# Patient Record
Sex: Female | Born: 1955 | Race: White | Hispanic: No | Marital: Single
Health system: Southern US, Community
[De-identification: ages and names within clinical notes are randomized; demographics above are authoritative.]

---

## 2005-02-10 ENCOUNTER — Emergency Department: Payer: Self-pay | Admitting: Emergency Medicine

## 2005-02-13 ENCOUNTER — Emergency Department: Payer: Self-pay | Admitting: Emergency Medicine

## 2005-12-10 ENCOUNTER — Other Ambulatory Visit: Payer: Self-pay

## 2005-12-11 ENCOUNTER — Inpatient Hospital Stay: Payer: Self-pay | Admitting: Internal Medicine

## 2011-07-08 ENCOUNTER — Emergency Department: Payer: Self-pay | Admitting: Emergency Medicine

## 2013-02-26 IMAGING — CR RIGHT FOOT COMPLETE - 3+ VIEW
1 series · 3 of 3 positions shown · non-contrast
Comparison: none

REASON FOR EXAM: pain
COMMENTS:   May transport without cardiac monitor

PROCEDURE:     DXR - DXR FOOT RT COMPLETE W/OBLIQUES  - July 08, 2011 [DATE]
RESULT:     Comparison: None.

[Series 1: ap · 0.17mm/px · 3 of 3 slices shown]
[im 1/3]
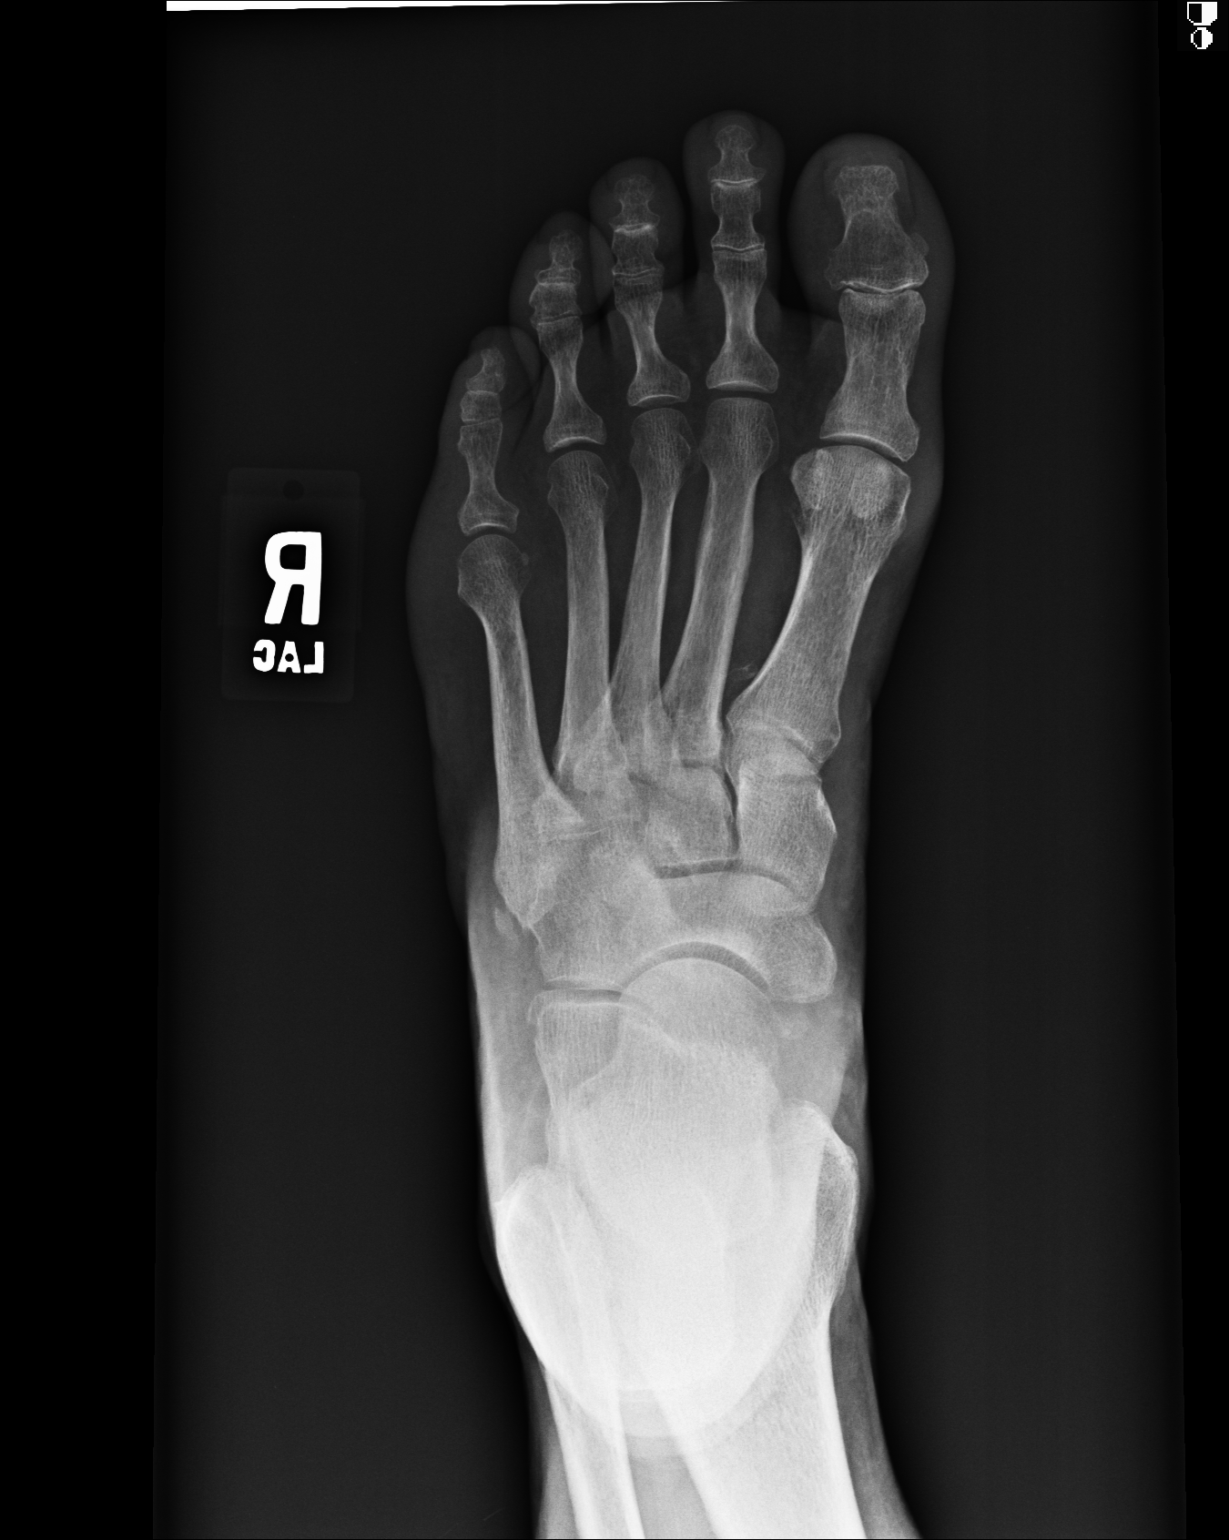
[im 2/3]
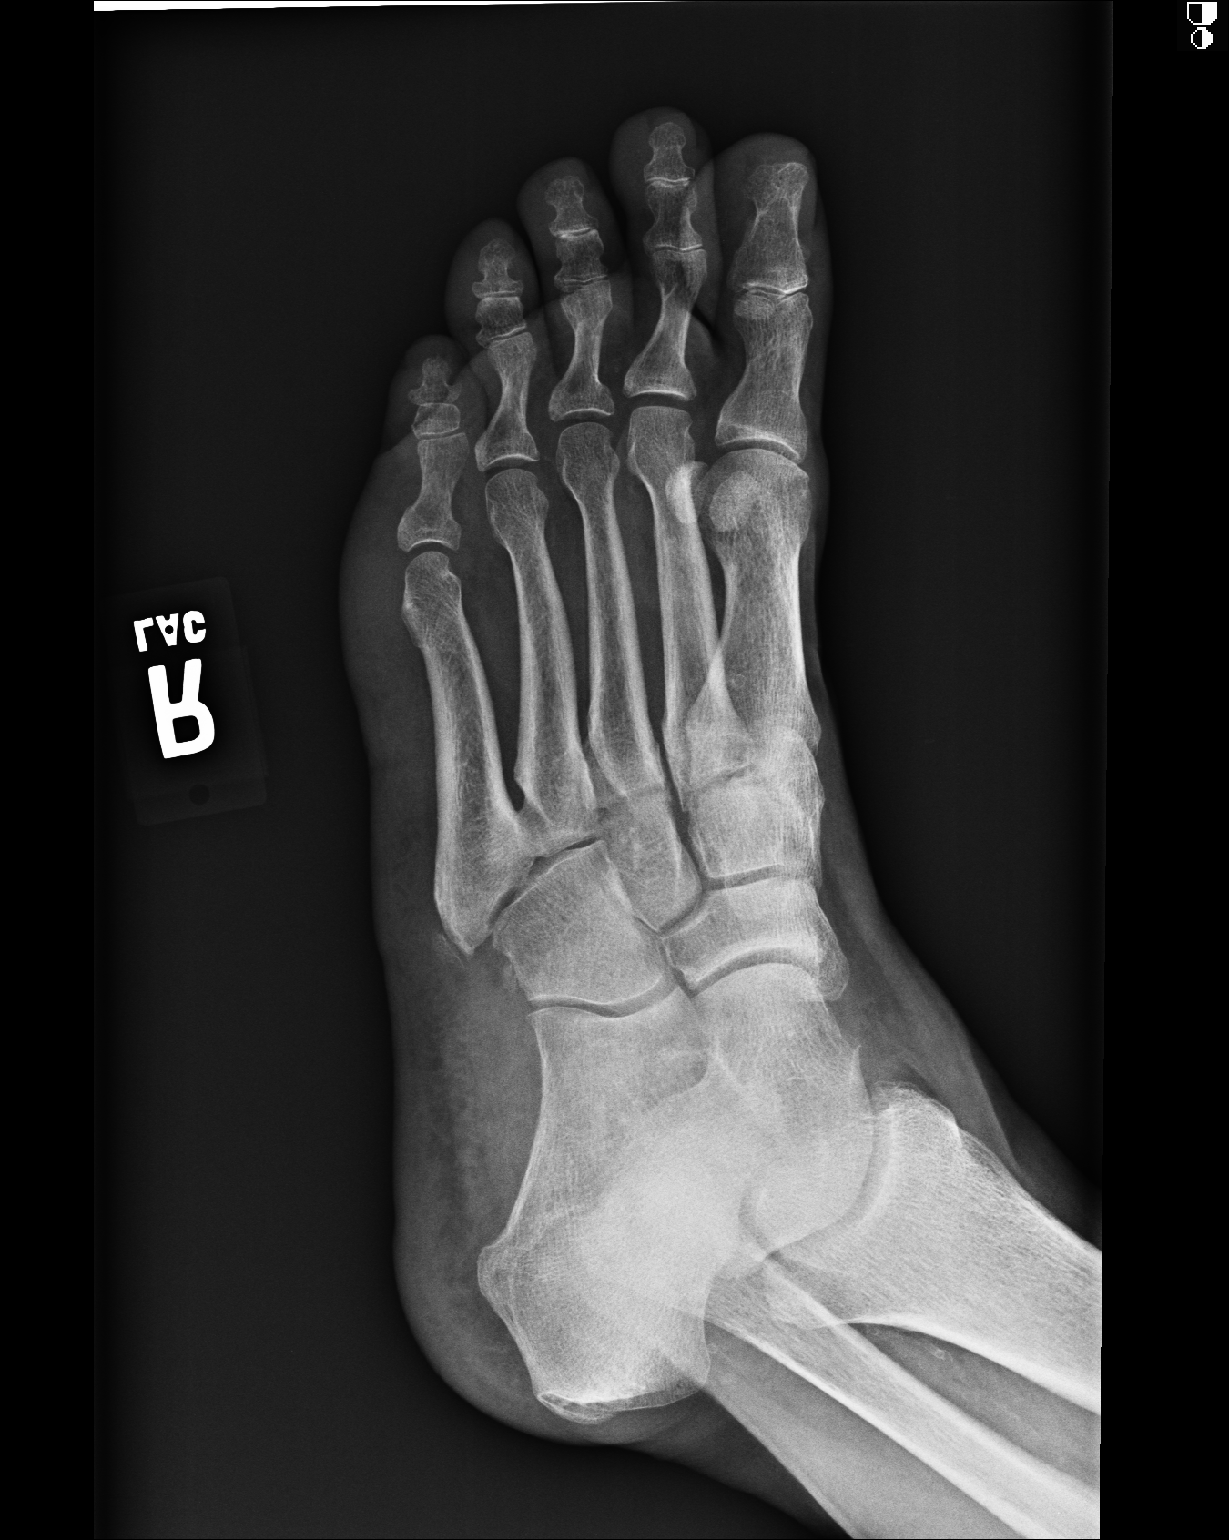
[im 3/3]
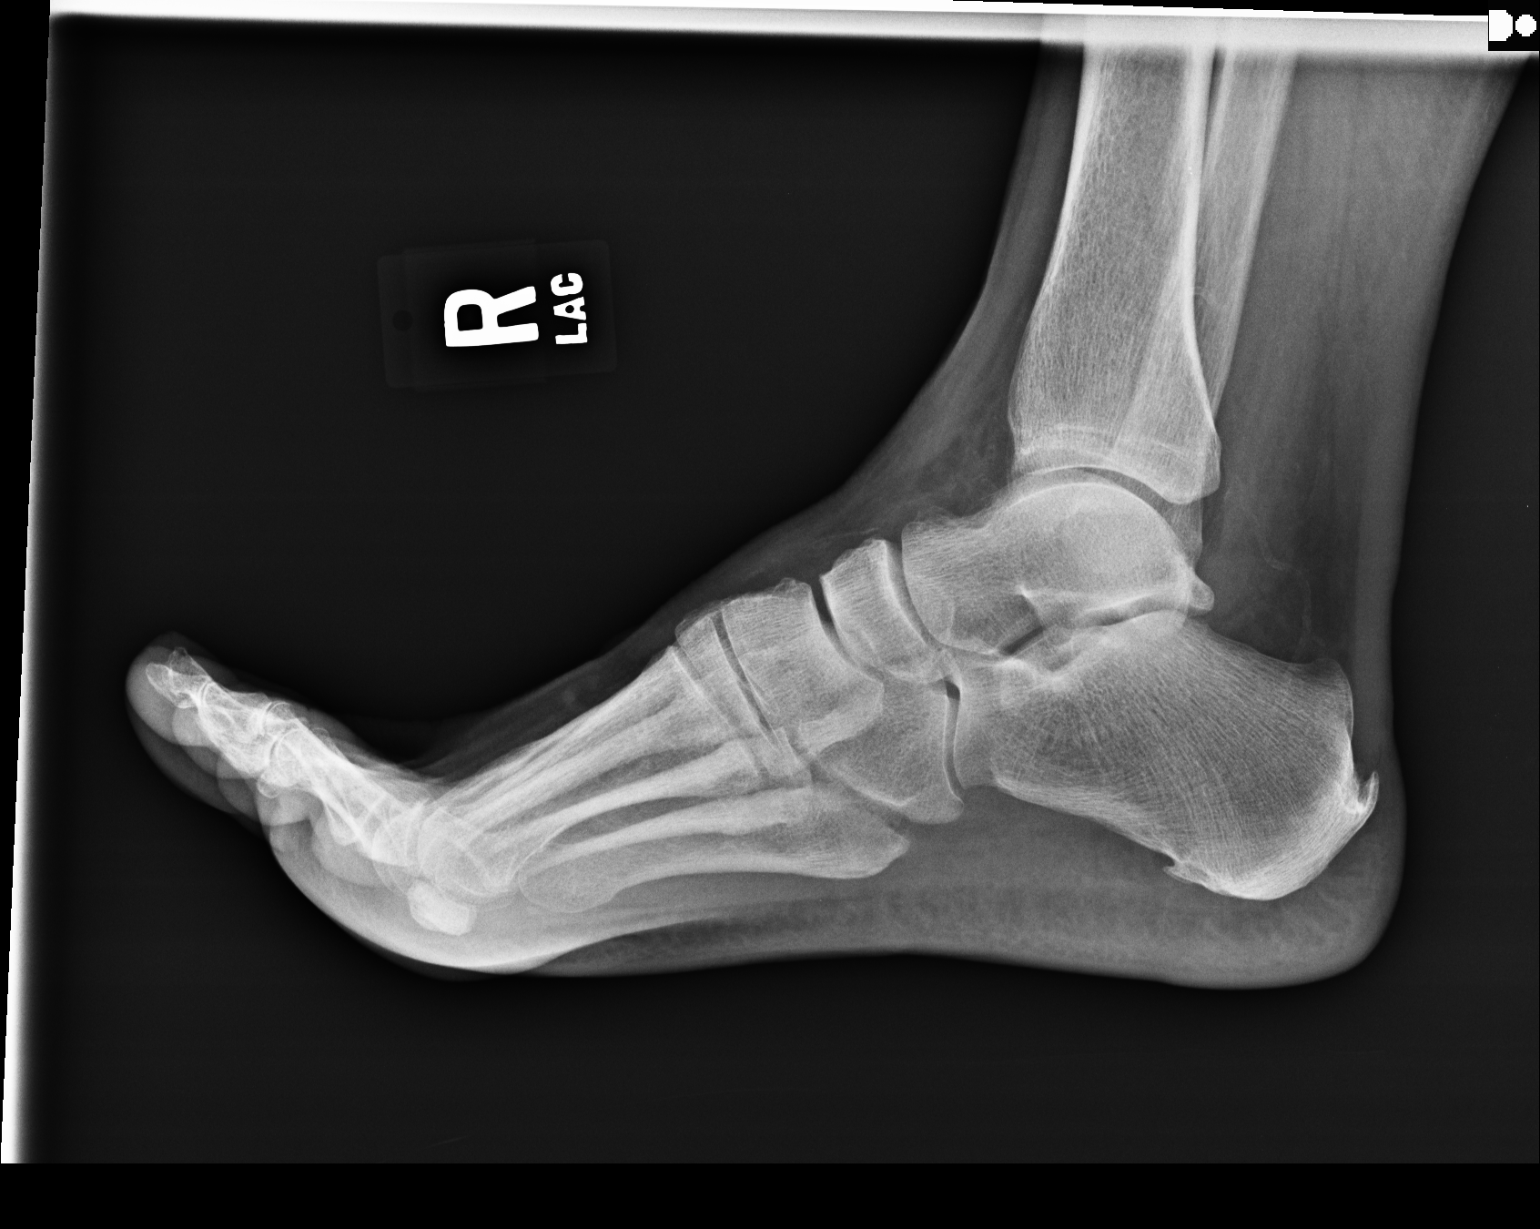

[3 of 3 positions shown; findings below may reference images not displayed]

FINDINGS: No acute fracture. Normal alignment. Joint spaces are maintained. There are
vascular calcifications.
IMPRESSION: No acute findings.

## 2018-09-24 ENCOUNTER — Other Ambulatory Visit: Payer: Self-pay | Admitting: Physician Assistant

## 2018-09-24 NOTE — Telephone Encounter (Signed)
Refill approved for Acyclovir 800mg  #30 1 po QD for 7 months.

## 2019-04-12 ENCOUNTER — Other Ambulatory Visit: Payer: Self-pay | Admitting: Physician Assistant

## 2019-04-12 NOTE — Telephone Encounter (Signed)
Will approve Rx for refills for 6 months today.  Will need in-person visit before any further refills.

## 2020-05-12 ENCOUNTER — Other Ambulatory Visit: Payer: Self-pay | Admitting: Physician Assistant

## 2020-05-12 NOTE — Telephone Encounter (Signed)
Per chart review, patient had Rx approval for 6 mo of refills in 04/2019, with note that she would need in-person visit prior to further refills. Will OK one refill only with note that patient needs in-person visit.

## 2020-08-21 ENCOUNTER — Other Ambulatory Visit: Payer: Self-pay | Admitting: Nephrology

## 2020-08-21 DIAGNOSIS — N183 Chronic kidney disease, stage 3 unspecified: Secondary | ICD-10-CM

## 2020-09-28 DIAGNOSIS — Z89411 Acquired absence of right great toe: Secondary | ICD-10-CM | POA: Diagnosis not present

## 2020-09-28 DIAGNOSIS — Z89431 Acquired absence of right foot: Secondary | ICD-10-CM | POA: Diagnosis not present

## 2020-10-11 ENCOUNTER — Other Ambulatory Visit: Payer: Self-pay | Admitting: Physician Assistant

## 2020-10-12 NOTE — Telephone Encounter (Signed)
Per chart review, patient needs an in-person visit for further refills.  No in-person visits documented since change to Epic in 07/2018 and several refills approved.  No further refills until after an in-person visit.

## 2020-10-19 DIAGNOSIS — E1142 Type 2 diabetes mellitus with diabetic polyneuropathy: Secondary | ICD-10-CM | POA: Diagnosis not present

## 2020-10-20 DIAGNOSIS — Z0000 Encounter for general adult medical examination without abnormal findings: Secondary | ICD-10-CM | POA: Diagnosis not present

## 2020-10-20 DIAGNOSIS — D519 Vitamin B12 deficiency anemia, unspecified: Secondary | ICD-10-CM | POA: Diagnosis not present

## 2020-10-20 DIAGNOSIS — Z794 Long term (current) use of insulin: Secondary | ICD-10-CM | POA: Diagnosis not present

## 2020-10-20 DIAGNOSIS — E1142 Type 2 diabetes mellitus with diabetic polyneuropathy: Secondary | ICD-10-CM | POA: Diagnosis not present

## 2020-10-20 DIAGNOSIS — D649 Anemia, unspecified: Secondary | ICD-10-CM | POA: Diagnosis not present

## 2020-10-20 DIAGNOSIS — I10 Essential (primary) hypertension: Secondary | ICD-10-CM | POA: Diagnosis not present

## 2020-10-20 DIAGNOSIS — E782 Mixed hyperlipidemia: Secondary | ICD-10-CM | POA: Diagnosis not present

## 2020-10-20 DIAGNOSIS — E1165 Type 2 diabetes mellitus with hyperglycemia: Secondary | ICD-10-CM | POA: Diagnosis not present

## 2020-10-20 DIAGNOSIS — K219 Gastro-esophageal reflux disease without esophagitis: Secondary | ICD-10-CM | POA: Diagnosis not present

## 2020-10-20 DIAGNOSIS — D631 Anemia in chronic kidney disease: Secondary | ICD-10-CM | POA: Diagnosis not present

## 2020-10-20 DIAGNOSIS — D518 Other vitamin B12 deficiency anemias: Secondary | ICD-10-CM | POA: Diagnosis not present

## 2020-10-20 DIAGNOSIS — N1831 Chronic kidney disease, stage 3a: Secondary | ICD-10-CM | POA: Diagnosis not present

## 2020-11-10 ENCOUNTER — Ambulatory Visit
Admission: RE | Admit: 2020-11-10 | Discharge: 2020-11-10 | Disposition: A | Discharge status: Home / Self Care | Payer: Self-pay | Source: Ambulatory Visit | Attending: Nephrology | Admitting: Nephrology

## 2020-11-10 DIAGNOSIS — N183 Chronic kidney disease, stage 3 unspecified: Secondary | ICD-10-CM | POA: Diagnosis not present

## 2020-11-19 DIAGNOSIS — E1142 Type 2 diabetes mellitus with diabetic polyneuropathy: Secondary | ICD-10-CM | POA: Diagnosis not present

## 2020-11-24 DIAGNOSIS — Z794 Long term (current) use of insulin: Secondary | ICD-10-CM | POA: Diagnosis not present

## 2020-11-24 DIAGNOSIS — E113313 Type 2 diabetes mellitus with moderate nonproliferative diabetic retinopathy with macular edema, bilateral: Secondary | ICD-10-CM | POA: Diagnosis not present

## 2020-11-24 DIAGNOSIS — Z7984 Long term (current) use of oral hypoglycemic drugs: Secondary | ICD-10-CM | POA: Diagnosis not present

## 2020-11-24 DIAGNOSIS — H34832 Tributary (branch) retinal vein occlusion, left eye, with macular edema: Secondary | ICD-10-CM | POA: Diagnosis not present

## 2020-12-14 DIAGNOSIS — Z794 Long term (current) use of insulin: Secondary | ICD-10-CM | POA: Diagnosis not present

## 2020-12-14 DIAGNOSIS — E782 Mixed hyperlipidemia: Secondary | ICD-10-CM | POA: Diagnosis not present

## 2020-12-14 DIAGNOSIS — E113399 Type 2 diabetes mellitus with moderate nonproliferative diabetic retinopathy without macular edema, unspecified eye: Secondary | ICD-10-CM | POA: Diagnosis not present

## 2020-12-14 DIAGNOSIS — Z7984 Long term (current) use of oral hypoglycemic drugs: Secondary | ICD-10-CM | POA: Diagnosis not present

## 2020-12-14 DIAGNOSIS — E1142 Type 2 diabetes mellitus with diabetic polyneuropathy: Secondary | ICD-10-CM | POA: Diagnosis not present

## 2020-12-14 DIAGNOSIS — I10 Essential (primary) hypertension: Secondary | ICD-10-CM | POA: Diagnosis not present

## 2020-12-19 DIAGNOSIS — E1142 Type 2 diabetes mellitus with diabetic polyneuropathy: Secondary | ICD-10-CM | POA: Diagnosis not present

## 2020-12-22 DIAGNOSIS — E113313 Type 2 diabetes mellitus with moderate nonproliferative diabetic retinopathy with macular edema, bilateral: Secondary | ICD-10-CM | POA: Diagnosis not present

## 2020-12-22 DIAGNOSIS — Z794 Long term (current) use of insulin: Secondary | ICD-10-CM | POA: Diagnosis not present

## 2020-12-22 DIAGNOSIS — H34832 Tributary (branch) retinal vein occlusion, left eye, with macular edema: Secondary | ICD-10-CM | POA: Diagnosis not present

## 2021-01-19 DIAGNOSIS — E1142 Type 2 diabetes mellitus with diabetic polyneuropathy: Secondary | ICD-10-CM | POA: Diagnosis not present

## 2021-01-20 DIAGNOSIS — N1831 Chronic kidney disease, stage 3a: Secondary | ICD-10-CM | POA: Diagnosis not present

## 2021-01-20 DIAGNOSIS — I10 Essential (primary) hypertension: Secondary | ICD-10-CM | POA: Diagnosis not present

## 2021-01-20 DIAGNOSIS — D518 Other vitamin B12 deficiency anemias: Secondary | ICD-10-CM | POA: Diagnosis not present

## 2021-01-20 DIAGNOSIS — E782 Mixed hyperlipidemia: Secondary | ICD-10-CM | POA: Diagnosis not present

## 2021-01-20 DIAGNOSIS — D631 Anemia in chronic kidney disease: Secondary | ICD-10-CM | POA: Diagnosis not present

## 2021-01-20 DIAGNOSIS — E1142 Type 2 diabetes mellitus with diabetic polyneuropathy: Secondary | ICD-10-CM | POA: Diagnosis not present

## 2021-01-20 DIAGNOSIS — K219 Gastro-esophageal reflux disease without esophagitis: Secondary | ICD-10-CM | POA: Diagnosis not present

## 2021-01-20 DIAGNOSIS — Z23 Encounter for immunization: Secondary | ICD-10-CM | POA: Diagnosis not present

## 2021-01-20 DIAGNOSIS — E1165 Type 2 diabetes mellitus with hyperglycemia: Secondary | ICD-10-CM | POA: Diagnosis not present

## 2021-01-20 DIAGNOSIS — Z794 Long term (current) use of insulin: Secondary | ICD-10-CM | POA: Diagnosis not present

## 2021-01-20 DIAGNOSIS — D649 Anemia, unspecified: Secondary | ICD-10-CM | POA: Diagnosis not present

## 2021-02-02 DIAGNOSIS — Z794 Long term (current) use of insulin: Secondary | ICD-10-CM | POA: Diagnosis not present

## 2021-02-02 DIAGNOSIS — E113313 Type 2 diabetes mellitus with moderate nonproliferative diabetic retinopathy with macular edema, bilateral: Secondary | ICD-10-CM | POA: Diagnosis not present

## 2021-02-02 DIAGNOSIS — Z7984 Long term (current) use of oral hypoglycemic drugs: Secondary | ICD-10-CM | POA: Diagnosis not present

## 2021-02-02 DIAGNOSIS — H34832 Tributary (branch) retinal vein occlusion, left eye, with macular edema: Secondary | ICD-10-CM | POA: Diagnosis not present

## 2021-02-18 DIAGNOSIS — E1142 Type 2 diabetes mellitus with diabetic polyneuropathy: Secondary | ICD-10-CM | POA: Diagnosis not present

## 2021-03-08 DIAGNOSIS — N183 Chronic kidney disease, stage 3 unspecified: Secondary | ICD-10-CM | POA: Diagnosis not present

## 2021-03-08 DIAGNOSIS — N390 Urinary tract infection, site not specified: Secondary | ICD-10-CM | POA: Diagnosis not present

## 2021-03-09 DIAGNOSIS — Z794 Long term (current) use of insulin: Secondary | ICD-10-CM | POA: Diagnosis not present

## 2021-03-09 DIAGNOSIS — H34832 Tributary (branch) retinal vein occlusion, left eye, with macular edema: Secondary | ICD-10-CM | POA: Diagnosis not present

## 2021-03-09 DIAGNOSIS — E113313 Type 2 diabetes mellitus with moderate nonproliferative diabetic retinopathy with macular edema, bilateral: Secondary | ICD-10-CM | POA: Diagnosis not present

## 2021-03-09 DIAGNOSIS — Z7984 Long term (current) use of oral hypoglycemic drugs: Secondary | ICD-10-CM | POA: Diagnosis not present

## 2021-03-15 DIAGNOSIS — D631 Anemia in chronic kidney disease: Secondary | ICD-10-CM | POA: Diagnosis not present

## 2021-03-15 DIAGNOSIS — I129 Hypertensive chronic kidney disease with stage 1 through stage 4 chronic kidney disease, or unspecified chronic kidney disease: Secondary | ICD-10-CM | POA: Diagnosis not present

## 2021-03-15 DIAGNOSIS — E1122 Type 2 diabetes mellitus with diabetic chronic kidney disease: Secondary | ICD-10-CM | POA: Diagnosis not present

## 2021-03-15 DIAGNOSIS — N2581 Secondary hyperparathyroidism of renal origin: Secondary | ICD-10-CM | POA: Diagnosis not present

## 2021-03-15 DIAGNOSIS — N183 Chronic kidney disease, stage 3 unspecified: Secondary | ICD-10-CM | POA: Diagnosis not present

## 2021-03-15 DIAGNOSIS — E785 Hyperlipidemia, unspecified: Secondary | ICD-10-CM | POA: Diagnosis not present

## 2021-03-15 DIAGNOSIS — N390 Urinary tract infection, site not specified: Secondary | ICD-10-CM | POA: Diagnosis not present

## 2021-03-21 DIAGNOSIS — E1142 Type 2 diabetes mellitus with diabetic polyneuropathy: Secondary | ICD-10-CM | POA: Diagnosis not present

## 2021-04-16 DIAGNOSIS — E782 Mixed hyperlipidemia: Secondary | ICD-10-CM | POA: Diagnosis not present

## 2021-04-16 DIAGNOSIS — E1142 Type 2 diabetes mellitus with diabetic polyneuropathy: Secondary | ICD-10-CM | POA: Diagnosis not present

## 2021-04-16 DIAGNOSIS — Z89411 Acquired absence of right great toe: Secondary | ICD-10-CM | POA: Diagnosis not present

## 2021-04-16 DIAGNOSIS — Z794 Long term (current) use of insulin: Secondary | ICD-10-CM | POA: Diagnosis not present

## 2021-04-16 DIAGNOSIS — Z7984 Long term (current) use of oral hypoglycemic drugs: Secondary | ICD-10-CM | POA: Diagnosis not present

## 2021-04-16 DIAGNOSIS — E113393 Type 2 diabetes mellitus with moderate nonproliferative diabetic retinopathy without macular edema, bilateral: Secondary | ICD-10-CM | POA: Diagnosis not present

## 2021-04-16 DIAGNOSIS — Z87891 Personal history of nicotine dependence: Secondary | ICD-10-CM | POA: Diagnosis not present

## 2021-04-16 DIAGNOSIS — I10 Essential (primary) hypertension: Secondary | ICD-10-CM | POA: Diagnosis not present

## 2021-04-21 DIAGNOSIS — E1142 Type 2 diabetes mellitus with diabetic polyneuropathy: Secondary | ICD-10-CM | POA: Diagnosis not present

## 2021-04-28 DIAGNOSIS — E782 Mixed hyperlipidemia: Secondary | ICD-10-CM | POA: Diagnosis not present

## 2021-04-28 DIAGNOSIS — D631 Anemia in chronic kidney disease: Secondary | ICD-10-CM | POA: Diagnosis not present

## 2021-04-28 DIAGNOSIS — D518 Other vitamin B12 deficiency anemias: Secondary | ICD-10-CM | POA: Diagnosis not present

## 2021-04-28 DIAGNOSIS — I10 Essential (primary) hypertension: Secondary | ICD-10-CM | POA: Diagnosis not present

## 2021-04-28 DIAGNOSIS — Z794 Long term (current) use of insulin: Secondary | ICD-10-CM | POA: Diagnosis not present

## 2021-04-28 DIAGNOSIS — N1831 Chronic kidney disease, stage 3a: Secondary | ICD-10-CM | POA: Diagnosis not present

## 2021-04-28 DIAGNOSIS — K219 Gastro-esophageal reflux disease without esophagitis: Secondary | ICD-10-CM | POA: Diagnosis not present

## 2021-04-28 DIAGNOSIS — E1142 Type 2 diabetes mellitus with diabetic polyneuropathy: Secondary | ICD-10-CM | POA: Diagnosis not present

## 2021-04-28 DIAGNOSIS — E1165 Type 2 diabetes mellitus with hyperglycemia: Secondary | ICD-10-CM | POA: Diagnosis not present

## 2021-04-28 DIAGNOSIS — D649 Anemia, unspecified: Secondary | ICD-10-CM | POA: Diagnosis not present

## 2021-05-18 DIAGNOSIS — E113313 Type 2 diabetes mellitus with moderate nonproliferative diabetic retinopathy with macular edema, bilateral: Secondary | ICD-10-CM | POA: Diagnosis not present

## 2021-05-18 DIAGNOSIS — Z7984 Long term (current) use of oral hypoglycemic drugs: Secondary | ICD-10-CM | POA: Diagnosis not present

## 2021-05-18 DIAGNOSIS — H34832 Tributary (branch) retinal vein occlusion, left eye, with macular edema: Secondary | ICD-10-CM | POA: Diagnosis not present

## 2021-05-18 DIAGNOSIS — Z794 Long term (current) use of insulin: Secondary | ICD-10-CM | POA: Diagnosis not present

## 2021-05-19 DIAGNOSIS — E1142 Type 2 diabetes mellitus with diabetic polyneuropathy: Secondary | ICD-10-CM | POA: Diagnosis not present

## 2021-06-19 DIAGNOSIS — E1142 Type 2 diabetes mellitus with diabetic polyneuropathy: Secondary | ICD-10-CM | POA: Diagnosis not present

## 2021-07-19 DIAGNOSIS — E1142 Type 2 diabetes mellitus with diabetic polyneuropathy: Secondary | ICD-10-CM | POA: Diagnosis not present

## 2021-07-29 DIAGNOSIS — Z1231 Encounter for screening mammogram for malignant neoplasm of breast: Secondary | ICD-10-CM | POA: Diagnosis not present

## 2021-08-03 DIAGNOSIS — I10 Essential (primary) hypertension: Secondary | ICD-10-CM | POA: Diagnosis not present

## 2021-08-03 DIAGNOSIS — Z0000 Encounter for general adult medical examination without abnormal findings: Secondary | ICD-10-CM | POA: Diagnosis not present

## 2021-08-03 DIAGNOSIS — D631 Anemia in chronic kidney disease: Secondary | ICD-10-CM | POA: Diagnosis not present

## 2021-08-03 DIAGNOSIS — E1165 Type 2 diabetes mellitus with hyperglycemia: Secondary | ICD-10-CM | POA: Diagnosis not present

## 2021-08-03 DIAGNOSIS — D649 Anemia, unspecified: Secondary | ICD-10-CM | POA: Diagnosis not present

## 2021-08-03 DIAGNOSIS — E782 Mixed hyperlipidemia: Secondary | ICD-10-CM | POA: Diagnosis not present

## 2021-08-03 DIAGNOSIS — E1142 Type 2 diabetes mellitus with diabetic polyneuropathy: Secondary | ICD-10-CM | POA: Diagnosis not present

## 2021-08-03 DIAGNOSIS — D518 Other vitamin B12 deficiency anemias: Secondary | ICD-10-CM | POA: Diagnosis not present

## 2021-08-03 DIAGNOSIS — N1831 Chronic kidney disease, stage 3a: Secondary | ICD-10-CM | POA: Diagnosis not present

## 2021-08-03 DIAGNOSIS — K219 Gastro-esophageal reflux disease without esophagitis: Secondary | ICD-10-CM | POA: Diagnosis not present

## 2021-08-03 DIAGNOSIS — Z794 Long term (current) use of insulin: Secondary | ICD-10-CM | POA: Diagnosis not present

## 2021-08-10 DIAGNOSIS — Z794 Long term (current) use of insulin: Secondary | ICD-10-CM | POA: Diagnosis not present

## 2021-08-10 DIAGNOSIS — H34832 Tributary (branch) retinal vein occlusion, left eye, with macular edema: Secondary | ICD-10-CM | POA: Diagnosis not present

## 2021-08-10 DIAGNOSIS — E113313 Type 2 diabetes mellitus with moderate nonproliferative diabetic retinopathy with macular edema, bilateral: Secondary | ICD-10-CM | POA: Diagnosis not present

## 2021-08-10 DIAGNOSIS — Z7984 Long term (current) use of oral hypoglycemic drugs: Secondary | ICD-10-CM | POA: Diagnosis not present

## 2021-08-19 DIAGNOSIS — E1142 Type 2 diabetes mellitus with diabetic polyneuropathy: Secondary | ICD-10-CM | POA: Diagnosis not present

## 2021-08-26 DIAGNOSIS — E113312 Type 2 diabetes mellitus with moderate nonproliferative diabetic retinopathy with macular edema, left eye: Secondary | ICD-10-CM | POA: Diagnosis not present

## 2021-08-26 DIAGNOSIS — H5203 Hypermetropia, bilateral: Secondary | ICD-10-CM | POA: Diagnosis not present

## 2021-08-26 DIAGNOSIS — E113391 Type 2 diabetes mellitus with moderate nonproliferative diabetic retinopathy without macular edema, right eye: Secondary | ICD-10-CM | POA: Diagnosis not present

## 2021-08-26 DIAGNOSIS — Z794 Long term (current) use of insulin: Secondary | ICD-10-CM | POA: Diagnosis not present

## 2021-08-26 DIAGNOSIS — H34832 Tributary (branch) retinal vein occlusion, left eye, with macular edema: Secondary | ICD-10-CM | POA: Diagnosis not present

## 2021-08-26 DIAGNOSIS — H25041 Posterior subcapsular polar age-related cataract, right eye: Secondary | ICD-10-CM | POA: Diagnosis not present

## 2021-08-26 DIAGNOSIS — H52203 Unspecified astigmatism, bilateral: Secondary | ICD-10-CM | POA: Diagnosis not present

## 2021-08-26 DIAGNOSIS — H35033 Hypertensive retinopathy, bilateral: Secondary | ICD-10-CM | POA: Diagnosis not present

## 2021-08-26 DIAGNOSIS — H2513 Age-related nuclear cataract, bilateral: Secondary | ICD-10-CM | POA: Diagnosis not present

## 2021-08-26 DIAGNOSIS — H25013 Cortical age-related cataract, bilateral: Secondary | ICD-10-CM | POA: Diagnosis not present

## 2021-08-26 DIAGNOSIS — H524 Presbyopia: Secondary | ICD-10-CM | POA: Diagnosis not present

## 2021-08-26 DIAGNOSIS — H43823 Vitreomacular adhesion, bilateral: Secondary | ICD-10-CM | POA: Diagnosis not present

## 2021-09-17 DIAGNOSIS — E782 Mixed hyperlipidemia: Secondary | ICD-10-CM | POA: Diagnosis not present

## 2021-09-17 DIAGNOSIS — Z7984 Long term (current) use of oral hypoglycemic drugs: Secondary | ICD-10-CM | POA: Diagnosis not present

## 2021-09-17 DIAGNOSIS — I10 Essential (primary) hypertension: Secondary | ICD-10-CM | POA: Diagnosis not present

## 2021-09-17 DIAGNOSIS — E1142 Type 2 diabetes mellitus with diabetic polyneuropathy: Secondary | ICD-10-CM | POA: Diagnosis not present

## 2021-09-17 DIAGNOSIS — E113393 Type 2 diabetes mellitus with moderate nonproliferative diabetic retinopathy without macular edema, bilateral: Secondary | ICD-10-CM | POA: Diagnosis not present

## 2021-09-17 DIAGNOSIS — Z794 Long term (current) use of insulin: Secondary | ICD-10-CM | POA: Diagnosis not present

## 2021-09-18 DIAGNOSIS — E1142 Type 2 diabetes mellitus with diabetic polyneuropathy: Secondary | ICD-10-CM | POA: Diagnosis not present

## 2021-10-18 DIAGNOSIS — N183 Chronic kidney disease, stage 3 unspecified: Secondary | ICD-10-CM | POA: Diagnosis not present

## 2021-10-18 DIAGNOSIS — N390 Urinary tract infection, site not specified: Secondary | ICD-10-CM | POA: Diagnosis not present

## 2021-10-19 DIAGNOSIS — E1142 Type 2 diabetes mellitus with diabetic polyneuropathy: Secondary | ICD-10-CM | POA: Diagnosis not present

## 2021-10-26 DIAGNOSIS — N2581 Secondary hyperparathyroidism of renal origin: Secondary | ICD-10-CM | POA: Diagnosis not present

## 2021-10-26 DIAGNOSIS — I129 Hypertensive chronic kidney disease with stage 1 through stage 4 chronic kidney disease, or unspecified chronic kidney disease: Secondary | ICD-10-CM | POA: Diagnosis not present

## 2021-10-26 DIAGNOSIS — D631 Anemia in chronic kidney disease: Secondary | ICD-10-CM | POA: Diagnosis not present

## 2021-10-26 DIAGNOSIS — N390 Urinary tract infection, site not specified: Secondary | ICD-10-CM | POA: Diagnosis not present

## 2021-10-26 DIAGNOSIS — N183 Chronic kidney disease, stage 3 unspecified: Secondary | ICD-10-CM | POA: Diagnosis not present

## 2021-10-26 DIAGNOSIS — E1122 Type 2 diabetes mellitus with diabetic chronic kidney disease: Secondary | ICD-10-CM | POA: Diagnosis not present

## 2021-11-09 DIAGNOSIS — I10 Essential (primary) hypertension: Secondary | ICD-10-CM | POA: Diagnosis not present

## 2021-11-09 DIAGNOSIS — K219 Gastro-esophageal reflux disease without esophagitis: Secondary | ICD-10-CM | POA: Diagnosis not present

## 2021-11-09 DIAGNOSIS — D649 Anemia, unspecified: Secondary | ICD-10-CM | POA: Diagnosis not present

## 2021-11-09 DIAGNOSIS — E782 Mixed hyperlipidemia: Secondary | ICD-10-CM | POA: Diagnosis not present

## 2021-11-09 DIAGNOSIS — Z0000 Encounter for general adult medical examination without abnormal findings: Secondary | ICD-10-CM | POA: Diagnosis not present

## 2021-11-09 DIAGNOSIS — E1142 Type 2 diabetes mellitus with diabetic polyneuropathy: Secondary | ICD-10-CM | POA: Diagnosis not present

## 2021-11-09 DIAGNOSIS — N1831 Chronic kidney disease, stage 3a: Secondary | ICD-10-CM | POA: Diagnosis not present

## 2021-11-09 DIAGNOSIS — E1165 Type 2 diabetes mellitus with hyperglycemia: Secondary | ICD-10-CM | POA: Diagnosis not present

## 2021-11-09 DIAGNOSIS — D518 Other vitamin B12 deficiency anemias: Secondary | ICD-10-CM | POA: Diagnosis not present

## 2021-11-09 DIAGNOSIS — Z794 Long term (current) use of insulin: Secondary | ICD-10-CM | POA: Diagnosis not present

## 2021-11-09 DIAGNOSIS — D631 Anemia in chronic kidney disease: Secondary | ICD-10-CM | POA: Diagnosis not present

## 2021-11-12 DIAGNOSIS — Z794 Long term (current) use of insulin: Secondary | ICD-10-CM | POA: Diagnosis not present

## 2021-11-12 DIAGNOSIS — H34832 Tributary (branch) retinal vein occlusion, left eye, with macular edema: Secondary | ICD-10-CM | POA: Diagnosis not present

## 2021-11-12 DIAGNOSIS — Z7984 Long term (current) use of oral hypoglycemic drugs: Secondary | ICD-10-CM | POA: Diagnosis not present

## 2021-11-12 DIAGNOSIS — E113391 Type 2 diabetes mellitus with moderate nonproliferative diabetic retinopathy without macular edema, right eye: Secondary | ICD-10-CM | POA: Diagnosis not present

## 2021-11-12 DIAGNOSIS — E113312 Type 2 diabetes mellitus with moderate nonproliferative diabetic retinopathy with macular edema, left eye: Secondary | ICD-10-CM | POA: Diagnosis not present

## 2021-11-19 DIAGNOSIS — E1142 Type 2 diabetes mellitus with diabetic polyneuropathy: Secondary | ICD-10-CM | POA: Diagnosis not present

## 2021-11-25 DIAGNOSIS — N183 Chronic kidney disease, stage 3 unspecified: Secondary | ICD-10-CM | POA: Diagnosis not present

## 2021-12-19 DIAGNOSIS — E1142 Type 2 diabetes mellitus with diabetic polyneuropathy: Secondary | ICD-10-CM | POA: Diagnosis not present

## 2022-01-17 DIAGNOSIS — Z794 Long term (current) use of insulin: Secondary | ICD-10-CM | POA: Diagnosis not present

## 2022-01-17 DIAGNOSIS — E782 Mixed hyperlipidemia: Secondary | ICD-10-CM | POA: Diagnosis not present

## 2022-01-17 DIAGNOSIS — N1831 Chronic kidney disease, stage 3a: Secondary | ICD-10-CM | POA: Diagnosis not present

## 2022-01-17 DIAGNOSIS — I10 Essential (primary) hypertension: Secondary | ICD-10-CM | POA: Diagnosis not present

## 2022-01-17 DIAGNOSIS — D631 Anemia in chronic kidney disease: Secondary | ICD-10-CM | POA: Diagnosis not present

## 2022-01-17 DIAGNOSIS — K219 Gastro-esophageal reflux disease without esophagitis: Secondary | ICD-10-CM | POA: Diagnosis not present

## 2022-01-17 DIAGNOSIS — D518 Other vitamin B12 deficiency anemias: Secondary | ICD-10-CM | POA: Diagnosis not present

## 2022-01-17 DIAGNOSIS — E1142 Type 2 diabetes mellitus with diabetic polyneuropathy: Secondary | ICD-10-CM | POA: Diagnosis not present

## 2022-01-17 DIAGNOSIS — E1165 Type 2 diabetes mellitus with hyperglycemia: Secondary | ICD-10-CM | POA: Diagnosis not present

## 2022-01-17 DIAGNOSIS — D649 Anemia, unspecified: Secondary | ICD-10-CM | POA: Diagnosis not present

## 2022-01-19 DIAGNOSIS — E1142 Type 2 diabetes mellitus with diabetic polyneuropathy: Secondary | ICD-10-CM | POA: Diagnosis not present

## 2022-02-14 DIAGNOSIS — N183 Chronic kidney disease, stage 3 unspecified: Secondary | ICD-10-CM | POA: Diagnosis not present

## 2022-02-15 DIAGNOSIS — E113313 Type 2 diabetes mellitus with moderate nonproliferative diabetic retinopathy with macular edema, bilateral: Secondary | ICD-10-CM | POA: Diagnosis not present

## 2022-02-15 DIAGNOSIS — Z794 Long term (current) use of insulin: Secondary | ICD-10-CM | POA: Diagnosis not present

## 2022-02-15 DIAGNOSIS — Z7984 Long term (current) use of oral hypoglycemic drugs: Secondary | ICD-10-CM | POA: Diagnosis not present

## 2022-02-15 DIAGNOSIS — H34832 Tributary (branch) retinal vein occlusion, left eye, with macular edema: Secondary | ICD-10-CM | POA: Diagnosis not present

## 2022-02-18 DIAGNOSIS — E1142 Type 2 diabetes mellitus with diabetic polyneuropathy: Secondary | ICD-10-CM | POA: Diagnosis not present

## 2022-02-22 DIAGNOSIS — N183 Chronic kidney disease, stage 3 unspecified: Secondary | ICD-10-CM | POA: Diagnosis not present

## 2022-02-22 DIAGNOSIS — D631 Anemia in chronic kidney disease: Secondary | ICD-10-CM | POA: Diagnosis not present

## 2022-02-22 DIAGNOSIS — I129 Hypertensive chronic kidney disease with stage 1 through stage 4 chronic kidney disease, or unspecified chronic kidney disease: Secondary | ICD-10-CM | POA: Diagnosis not present

## 2022-02-22 DIAGNOSIS — N2581 Secondary hyperparathyroidism of renal origin: Secondary | ICD-10-CM | POA: Diagnosis not present

## 2022-02-22 DIAGNOSIS — E1122 Type 2 diabetes mellitus with diabetic chronic kidney disease: Secondary | ICD-10-CM | POA: Diagnosis not present

## 2022-02-27 DIAGNOSIS — I10 Essential (primary) hypertension: Secondary | ICD-10-CM | POA: Diagnosis not present

## 2022-02-27 DIAGNOSIS — E1165 Type 2 diabetes mellitus with hyperglycemia: Secondary | ICD-10-CM | POA: Diagnosis not present

## 2022-02-27 DIAGNOSIS — N1831 Chronic kidney disease, stage 3a: Secondary | ICD-10-CM | POA: Diagnosis not present

## 2022-03-21 DIAGNOSIS — I10 Essential (primary) hypertension: Secondary | ICD-10-CM | POA: Diagnosis not present

## 2022-03-21 DIAGNOSIS — Z87891 Personal history of nicotine dependence: Secondary | ICD-10-CM | POA: Diagnosis not present

## 2022-03-21 DIAGNOSIS — E1142 Type 2 diabetes mellitus with diabetic polyneuropathy: Secondary | ICD-10-CM | POA: Diagnosis not present

## 2022-03-21 DIAGNOSIS — Z7984 Long term (current) use of oral hypoglycemic drugs: Secondary | ICD-10-CM | POA: Diagnosis not present

## 2022-03-21 DIAGNOSIS — E113393 Type 2 diabetes mellitus with moderate nonproliferative diabetic retinopathy without macular edema, bilateral: Secondary | ICD-10-CM | POA: Diagnosis not present

## 2022-03-21 DIAGNOSIS — Z794 Long term (current) use of insulin: Secondary | ICD-10-CM | POA: Diagnosis not present

## 2022-03-21 DIAGNOSIS — E782 Mixed hyperlipidemia: Secondary | ICD-10-CM | POA: Diagnosis not present

## 2022-04-21 DIAGNOSIS — E1142 Type 2 diabetes mellitus with diabetic polyneuropathy: Secondary | ICD-10-CM | POA: Diagnosis not present

## 2022-05-02 DIAGNOSIS — J209 Acute bronchitis, unspecified: Secondary | ICD-10-CM | POA: Diagnosis not present

## 2022-05-02 DIAGNOSIS — R062 Wheezing: Secondary | ICD-10-CM | POA: Diagnosis not present

## 2022-05-09 DIAGNOSIS — I10 Essential (primary) hypertension: Secondary | ICD-10-CM | POA: Diagnosis not present

## 2022-05-09 DIAGNOSIS — J4520 Mild intermittent asthma, uncomplicated: Secondary | ICD-10-CM | POA: Diagnosis not present

## 2022-05-09 DIAGNOSIS — E1142 Type 2 diabetes mellitus with diabetic polyneuropathy: Secondary | ICD-10-CM | POA: Diagnosis not present

## 2022-05-09 DIAGNOSIS — Z794 Long term (current) use of insulin: Secondary | ICD-10-CM | POA: Diagnosis not present

## 2022-05-09 DIAGNOSIS — E782 Mixed hyperlipidemia: Secondary | ICD-10-CM | POA: Diagnosis not present

## 2022-05-09 DIAGNOSIS — D649 Anemia, unspecified: Secondary | ICD-10-CM | POA: Diagnosis not present

## 2022-05-09 DIAGNOSIS — D631 Anemia in chronic kidney disease: Secondary | ICD-10-CM | POA: Diagnosis not present

## 2022-05-09 DIAGNOSIS — N1831 Chronic kidney disease, stage 3a: Secondary | ICD-10-CM | POA: Diagnosis not present

## 2022-05-09 DIAGNOSIS — D518 Other vitamin B12 deficiency anemias: Secondary | ICD-10-CM | POA: Diagnosis not present

## 2022-05-09 DIAGNOSIS — K219 Gastro-esophageal reflux disease without esophagitis: Secondary | ICD-10-CM | POA: Diagnosis not present

## 2022-05-09 DIAGNOSIS — E1165 Type 2 diabetes mellitus with hyperglycemia: Secondary | ICD-10-CM | POA: Diagnosis not present

## 2022-05-20 DIAGNOSIS — E1142 Type 2 diabetes mellitus with diabetic polyneuropathy: Secondary | ICD-10-CM | POA: Diagnosis not present

## 2022-06-20 DIAGNOSIS — E1142 Type 2 diabetes mellitus with diabetic polyneuropathy: Secondary | ICD-10-CM | POA: Diagnosis not present

## 2022-07-02 IMAGING — US US RENAL
1 series · 14 of 25 positions shown · non-contrast
Comparison: None.

CLINICAL DATA: Stage 3 chronic kidney disease.

EXAM:
RENAL / URINARY TRACT ULTRASOUND COMPLETE

[Series 1: us renal · 0.21mm/px · 14 of 34 slices shown]
[im 1/34]
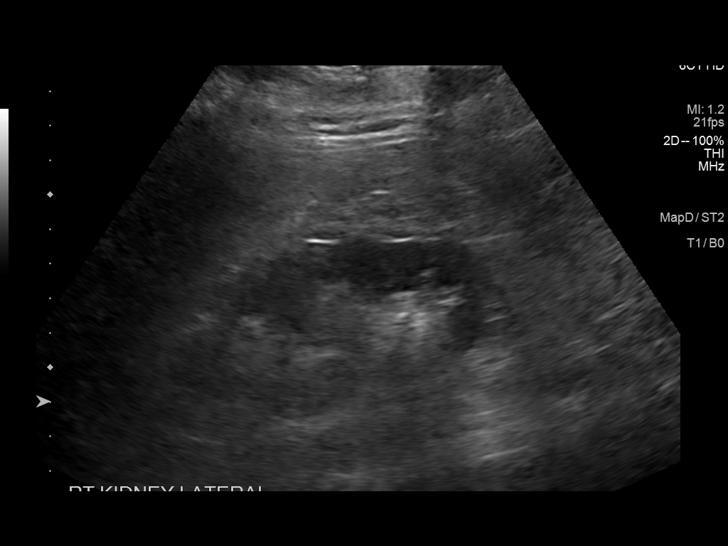
[im 3/34]
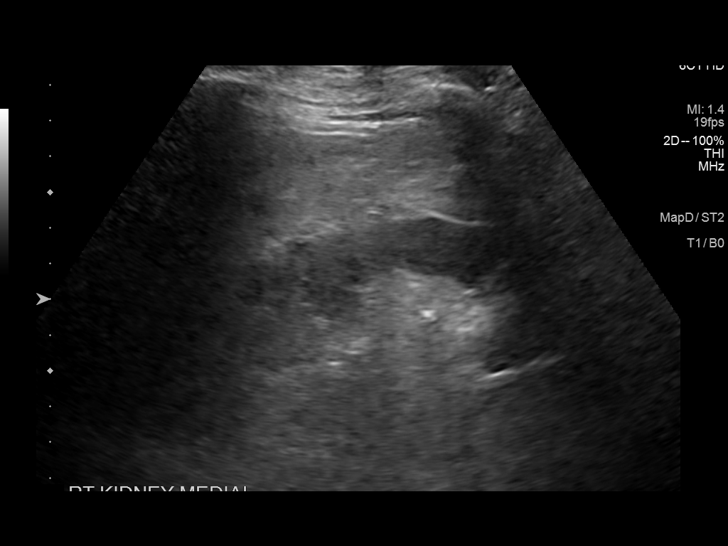
[im 6/34]
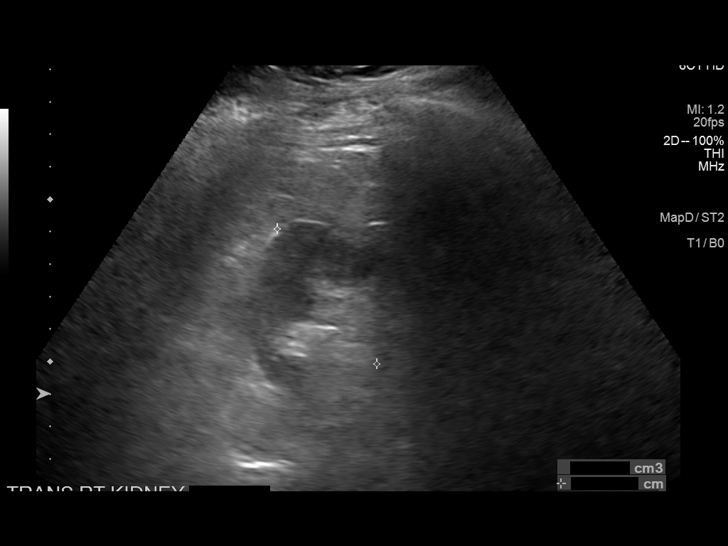
[im 9/34]
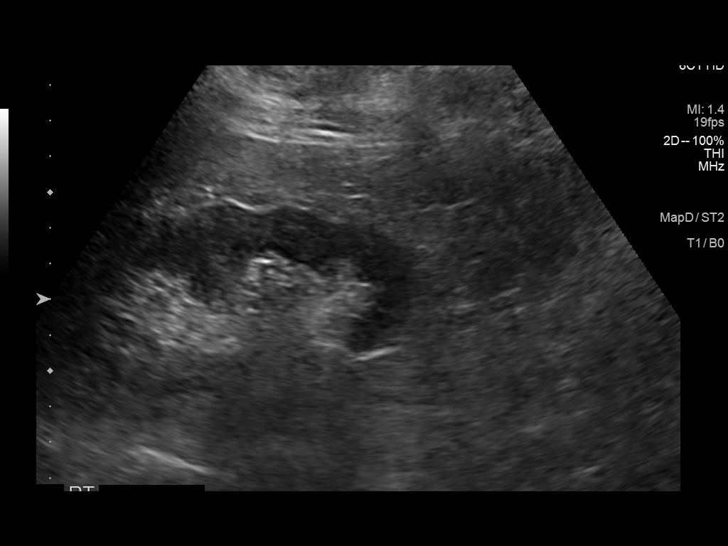
[im 12/34]
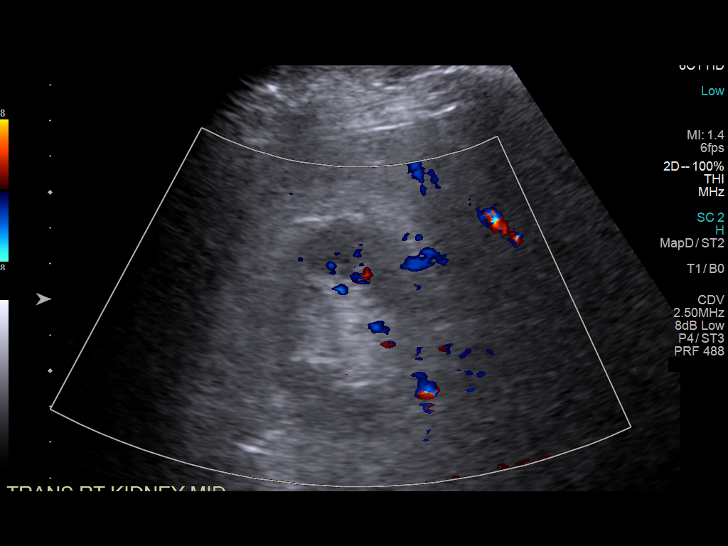
[im 13/34]
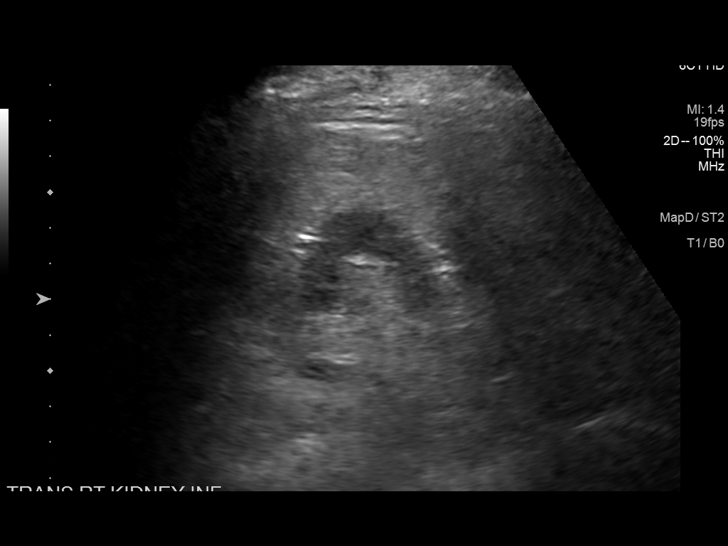
[im 16/34]
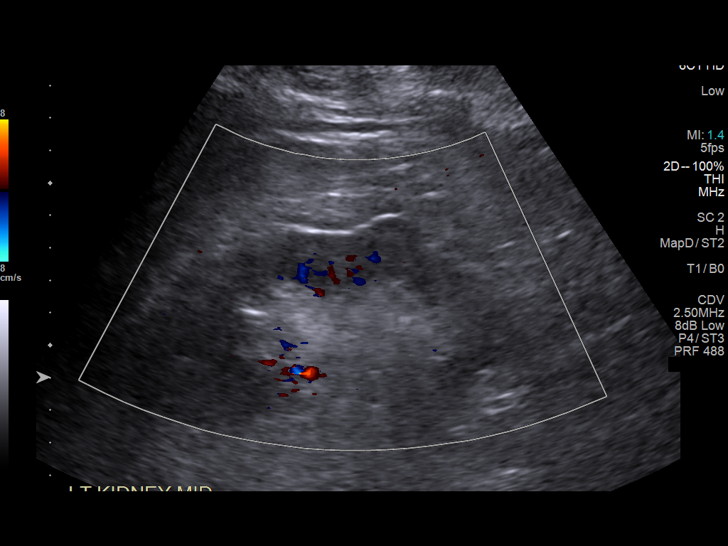
[im 18/34]
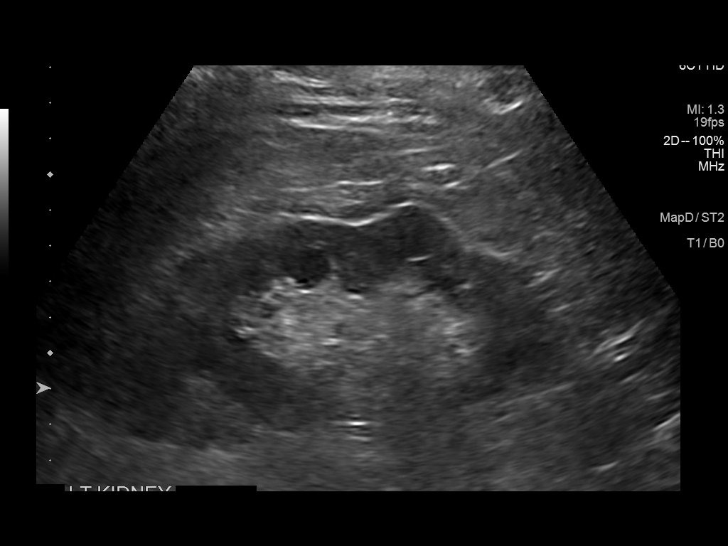
[im 21/34]
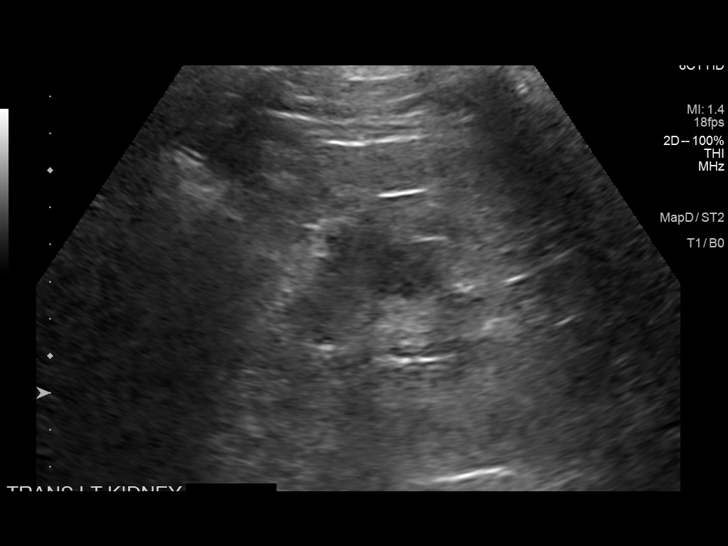
[im 23/34]
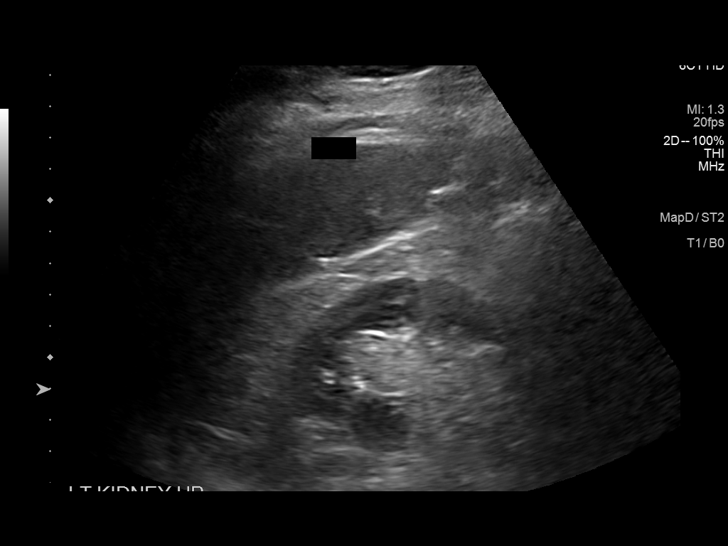
[im 25/34]
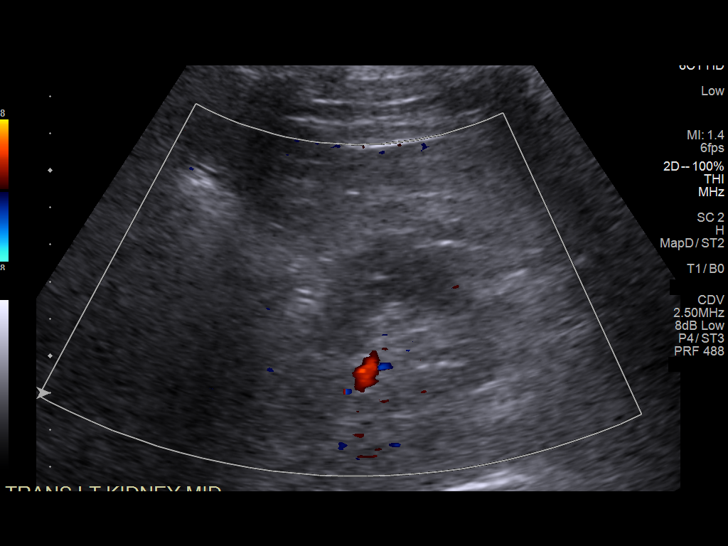
[im 28/34]
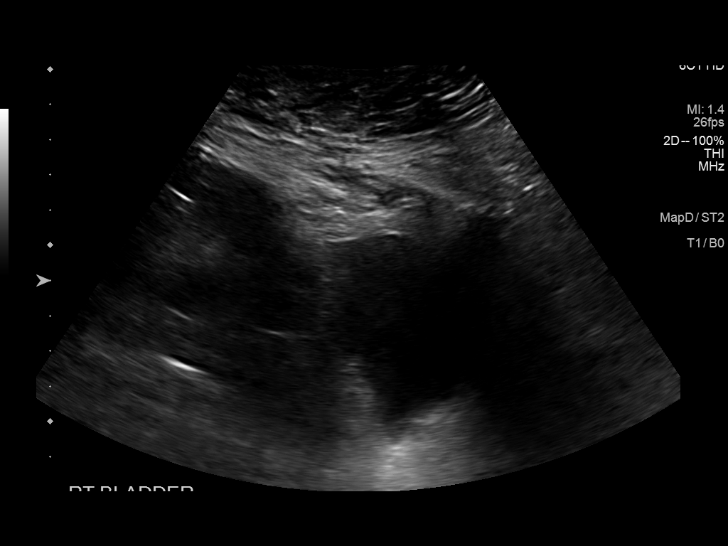
[im 31/34]
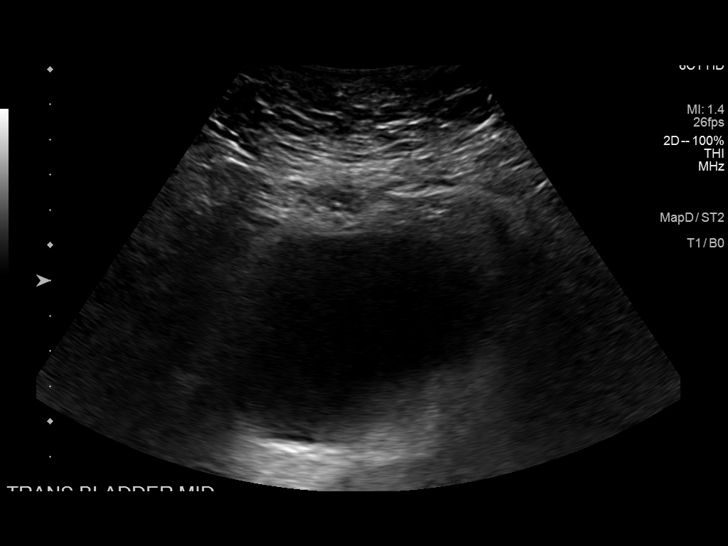
[im 34/34]
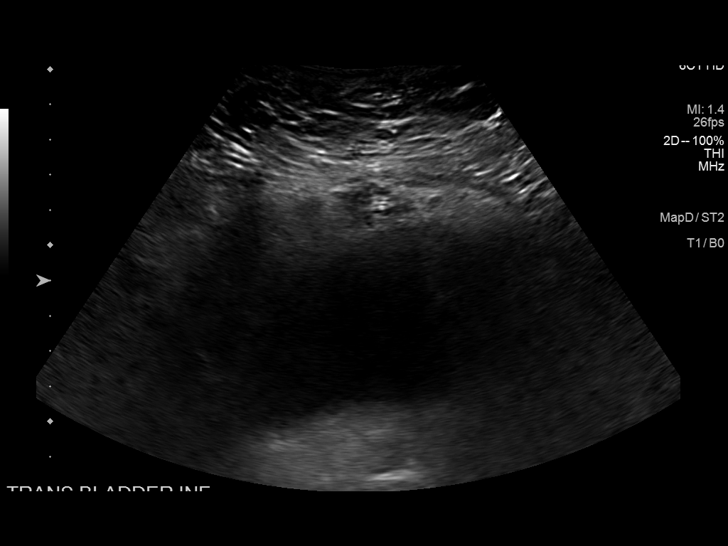

[14 of 25 positions shown; findings below may reference images not displayed]

FINDINGS: Right Kidney:

Renal measurements: 11.4 x 4.9 x 5.2 cm = volume: 152 mL.
Echogenicity within mildly increased limits. No mass or
hydronephrosis visualized.

Left Kidney:

Renal measurements: 11.4 x 5.8 x 4.6 cm = volume: 158 mL.
Echogenicity within nor mildly increased mal limits. No mass or
hydronephrosis visualized.

Bladder:

Appears normal for degree of bladder distention.

Other:

Examination is limited secondary to body habitus and overlying bowel
gas.
IMPRESSION: 1. No hydronephrosis.
2. Increased echogenicity in both kidneys likely related to medical
renal disease.

## 2022-07-20 DIAGNOSIS — E113393 Type 2 diabetes mellitus with moderate nonproliferative diabetic retinopathy without macular edema, bilateral: Secondary | ICD-10-CM | POA: Diagnosis not present

## 2022-07-20 DIAGNOSIS — Z794 Long term (current) use of insulin: Secondary | ICD-10-CM | POA: Diagnosis not present

## 2022-07-20 DIAGNOSIS — E782 Mixed hyperlipidemia: Secondary | ICD-10-CM | POA: Diagnosis not present

## 2022-07-20 DIAGNOSIS — Z7984 Long term (current) use of oral hypoglycemic drugs: Secondary | ICD-10-CM | POA: Diagnosis not present

## 2022-07-20 DIAGNOSIS — I10 Essential (primary) hypertension: Secondary | ICD-10-CM | POA: Diagnosis not present

## 2022-07-20 DIAGNOSIS — E1142 Type 2 diabetes mellitus with diabetic polyneuropathy: Secondary | ICD-10-CM | POA: Diagnosis not present

## 2022-08-01 DIAGNOSIS — K219 Gastro-esophageal reflux disease without esophagitis: Secondary | ICD-10-CM | POA: Diagnosis not present

## 2022-08-01 DIAGNOSIS — E782 Mixed hyperlipidemia: Secondary | ICD-10-CM | POA: Diagnosis not present

## 2022-08-01 DIAGNOSIS — E1165 Type 2 diabetes mellitus with hyperglycemia: Secondary | ICD-10-CM | POA: Diagnosis not present

## 2022-08-01 DIAGNOSIS — J4520 Mild intermittent asthma, uncomplicated: Secondary | ICD-10-CM | POA: Diagnosis not present

## 2022-08-01 DIAGNOSIS — Z794 Long term (current) use of insulin: Secondary | ICD-10-CM | POA: Diagnosis not present

## 2022-08-01 DIAGNOSIS — I10 Essential (primary) hypertension: Secondary | ICD-10-CM | POA: Diagnosis not present

## 2022-08-01 DIAGNOSIS — D631 Anemia in chronic kidney disease: Secondary | ICD-10-CM | POA: Diagnosis not present

## 2022-08-01 DIAGNOSIS — E1142 Type 2 diabetes mellitus with diabetic polyneuropathy: Secondary | ICD-10-CM | POA: Diagnosis not present

## 2022-08-01 DIAGNOSIS — D518 Other vitamin B12 deficiency anemias: Secondary | ICD-10-CM | POA: Diagnosis not present

## 2022-08-01 DIAGNOSIS — D649 Anemia, unspecified: Secondary | ICD-10-CM | POA: Diagnosis not present

## 2022-08-01 DIAGNOSIS — N1831 Chronic kidney disease, stage 3a: Secondary | ICD-10-CM | POA: Diagnosis not present

## 2022-08-18 DIAGNOSIS — D631 Anemia in chronic kidney disease: Secondary | ICD-10-CM | POA: Diagnosis not present

## 2022-08-18 DIAGNOSIS — N1831 Chronic kidney disease, stage 3a: Secondary | ICD-10-CM | POA: Diagnosis not present

## 2022-08-18 DIAGNOSIS — E1165 Type 2 diabetes mellitus with hyperglycemia: Secondary | ICD-10-CM | POA: Diagnosis not present

## 2022-08-18 DIAGNOSIS — E1142 Type 2 diabetes mellitus with diabetic polyneuropathy: Secondary | ICD-10-CM | POA: Diagnosis not present

## 2022-08-18 DIAGNOSIS — Z794 Long term (current) use of insulin: Secondary | ICD-10-CM | POA: Diagnosis not present

## 2022-08-18 DIAGNOSIS — J4520 Mild intermittent asthma, uncomplicated: Secondary | ICD-10-CM | POA: Diagnosis not present

## 2022-08-18 DIAGNOSIS — K219 Gastro-esophageal reflux disease without esophagitis: Secondary | ICD-10-CM | POA: Diagnosis not present

## 2022-08-18 DIAGNOSIS — I10 Essential (primary) hypertension: Secondary | ICD-10-CM | POA: Diagnosis not present

## 2022-08-18 DIAGNOSIS — E782 Mixed hyperlipidemia: Secondary | ICD-10-CM | POA: Diagnosis not present

## 2022-08-18 DIAGNOSIS — Z0001 Encounter for general adult medical examination with abnormal findings: Secondary | ICD-10-CM | POA: Diagnosis not present

## 2022-08-20 DIAGNOSIS — E1142 Type 2 diabetes mellitus with diabetic polyneuropathy: Secondary | ICD-10-CM | POA: Diagnosis not present

## 2022-09-13 DIAGNOSIS — H52203 Unspecified astigmatism, bilateral: Secondary | ICD-10-CM | POA: Diagnosis not present

## 2022-09-13 DIAGNOSIS — Z794 Long term (current) use of insulin: Secondary | ICD-10-CM | POA: Diagnosis not present

## 2022-09-13 DIAGNOSIS — H5203 Hypermetropia, bilateral: Secondary | ICD-10-CM | POA: Diagnosis not present

## 2022-09-13 DIAGNOSIS — H25013 Cortical age-related cataract, bilateral: Secondary | ICD-10-CM | POA: Diagnosis not present

## 2022-09-13 DIAGNOSIS — E113393 Type 2 diabetes mellitus with moderate nonproliferative diabetic retinopathy without macular edema, bilateral: Secondary | ICD-10-CM | POA: Diagnosis not present

## 2022-09-13 DIAGNOSIS — H524 Presbyopia: Secondary | ICD-10-CM | POA: Diagnosis not present

## 2022-09-13 DIAGNOSIS — H35043 Retinal micro-aneurysms, unspecified, bilateral: Secondary | ICD-10-CM | POA: Diagnosis not present

## 2022-09-13 DIAGNOSIS — H2513 Age-related nuclear cataract, bilateral: Secondary | ICD-10-CM | POA: Diagnosis not present

## 2022-09-19 DIAGNOSIS — E1142 Type 2 diabetes mellitus with diabetic polyneuropathy: Secondary | ICD-10-CM | POA: Diagnosis not present

## 2022-10-20 DIAGNOSIS — E1142 Type 2 diabetes mellitus with diabetic polyneuropathy: Secondary | ICD-10-CM | POA: Diagnosis not present

## 2022-11-20 DIAGNOSIS — E1142 Type 2 diabetes mellitus with diabetic polyneuropathy: Secondary | ICD-10-CM | POA: Diagnosis not present

## 2022-11-28 DIAGNOSIS — I10 Essential (primary) hypertension: Secondary | ICD-10-CM | POA: Diagnosis not present

## 2022-11-28 DIAGNOSIS — Z978 Presence of other specified devices: Secondary | ICD-10-CM | POA: Diagnosis not present

## 2022-11-28 DIAGNOSIS — E1142 Type 2 diabetes mellitus with diabetic polyneuropathy: Secondary | ICD-10-CM | POA: Diagnosis not present

## 2022-11-28 DIAGNOSIS — Z794 Long term (current) use of insulin: Secondary | ICD-10-CM | POA: Diagnosis not present

## 2022-11-28 DIAGNOSIS — Z7984 Long term (current) use of oral hypoglycemic drugs: Secondary | ICD-10-CM | POA: Diagnosis not present

## 2022-11-28 DIAGNOSIS — E782 Mixed hyperlipidemia: Secondary | ICD-10-CM | POA: Diagnosis not present

## 2022-12-05 DIAGNOSIS — K219 Gastro-esophageal reflux disease without esophagitis: Secondary | ICD-10-CM | POA: Diagnosis not present

## 2022-12-05 DIAGNOSIS — N1831 Chronic kidney disease, stage 3a: Secondary | ICD-10-CM | POA: Diagnosis not present

## 2022-12-05 DIAGNOSIS — E782 Mixed hyperlipidemia: Secondary | ICD-10-CM | POA: Diagnosis not present

## 2022-12-05 DIAGNOSIS — E1165 Type 2 diabetes mellitus with hyperglycemia: Secondary | ICD-10-CM | POA: Diagnosis not present

## 2022-12-05 DIAGNOSIS — Z794 Long term (current) use of insulin: Secondary | ICD-10-CM | POA: Diagnosis not present

## 2022-12-05 DIAGNOSIS — E1142 Type 2 diabetes mellitus with diabetic polyneuropathy: Secondary | ICD-10-CM | POA: Diagnosis not present

## 2022-12-05 DIAGNOSIS — D631 Anemia in chronic kidney disease: Secondary | ICD-10-CM | POA: Diagnosis not present

## 2022-12-05 DIAGNOSIS — Z23 Encounter for immunization: Secondary | ICD-10-CM | POA: Diagnosis not present

## 2022-12-05 DIAGNOSIS — Z0000 Encounter for general adult medical examination without abnormal findings: Secondary | ICD-10-CM | POA: Diagnosis not present

## 2022-12-05 DIAGNOSIS — I10 Essential (primary) hypertension: Secondary | ICD-10-CM | POA: Diagnosis not present

## 2022-12-05 DIAGNOSIS — J4520 Mild intermittent asthma, uncomplicated: Secondary | ICD-10-CM | POA: Diagnosis not present

## 2022-12-06 DIAGNOSIS — I10 Essential (primary) hypertension: Secondary | ICD-10-CM | POA: Diagnosis not present

## 2022-12-06 DIAGNOSIS — E782 Mixed hyperlipidemia: Secondary | ICD-10-CM | POA: Diagnosis not present

## 2022-12-06 DIAGNOSIS — J4520 Mild intermittent asthma, uncomplicated: Secondary | ICD-10-CM | POA: Diagnosis not present

## 2022-12-06 DIAGNOSIS — E1165 Type 2 diabetes mellitus with hyperglycemia: Secondary | ICD-10-CM | POA: Diagnosis not present

## 2022-12-06 DIAGNOSIS — E1142 Type 2 diabetes mellitus with diabetic polyneuropathy: Secondary | ICD-10-CM | POA: Diagnosis not present

## 2022-12-20 DIAGNOSIS — E1142 Type 2 diabetes mellitus with diabetic polyneuropathy: Secondary | ICD-10-CM | POA: Diagnosis not present

## 2023-01-05 DIAGNOSIS — M79672 Pain in left foot: Secondary | ICD-10-CM | POA: Diagnosis not present

## 2023-01-05 DIAGNOSIS — S92354A Nondisplaced fracture of fifth metatarsal bone, right foot, initial encounter for closed fracture: Secondary | ICD-10-CM | POA: Diagnosis not present

## 2023-01-20 DIAGNOSIS — E1142 Type 2 diabetes mellitus with diabetic polyneuropathy: Secondary | ICD-10-CM | POA: Diagnosis not present

## 2023-01-30 DIAGNOSIS — S92355D Nondisplaced fracture of fifth metatarsal bone, left foot, subsequent encounter for fracture with routine healing: Secondary | ICD-10-CM | POA: Diagnosis not present

## 2023-02-19 DIAGNOSIS — E1142 Type 2 diabetes mellitus with diabetic polyneuropathy: Secondary | ICD-10-CM | POA: Diagnosis not present

## 2023-03-13 DIAGNOSIS — J4520 Mild intermittent asthma, uncomplicated: Secondary | ICD-10-CM | POA: Diagnosis not present

## 2023-03-13 DIAGNOSIS — I10 Essential (primary) hypertension: Secondary | ICD-10-CM | POA: Diagnosis not present

## 2023-03-13 DIAGNOSIS — E782 Mixed hyperlipidemia: Secondary | ICD-10-CM | POA: Diagnosis not present

## 2023-03-13 DIAGNOSIS — E1142 Type 2 diabetes mellitus with diabetic polyneuropathy: Secondary | ICD-10-CM | POA: Diagnosis not present

## 2023-03-13 DIAGNOSIS — E1165 Type 2 diabetes mellitus with hyperglycemia: Secondary | ICD-10-CM | POA: Diagnosis not present

## 2023-03-13 DIAGNOSIS — K219 Gastro-esophageal reflux disease without esophagitis: Secondary | ICD-10-CM | POA: Diagnosis not present

## 2023-03-13 DIAGNOSIS — D631 Anemia in chronic kidney disease: Secondary | ICD-10-CM | POA: Diagnosis not present

## 2023-03-13 DIAGNOSIS — Z794 Long term (current) use of insulin: Secondary | ICD-10-CM | POA: Diagnosis not present

## 2023-03-13 DIAGNOSIS — N1831 Chronic kidney disease, stage 3a: Secondary | ICD-10-CM | POA: Diagnosis not present

## 2023-03-15 DIAGNOSIS — Z1211 Encounter for screening for malignant neoplasm of colon: Secondary | ICD-10-CM | POA: Diagnosis not present

## 2023-03-15 DIAGNOSIS — Z1212 Encounter for screening for malignant neoplasm of rectum: Secondary | ICD-10-CM | POA: Diagnosis not present

## 2023-03-22 DIAGNOSIS — E1142 Type 2 diabetes mellitus with diabetic polyneuropathy: Secondary | ICD-10-CM | POA: Diagnosis not present

## 2023-04-10 DIAGNOSIS — E1142 Type 2 diabetes mellitus with diabetic polyneuropathy: Secondary | ICD-10-CM | POA: Diagnosis not present

## 2023-04-10 DIAGNOSIS — E782 Mixed hyperlipidemia: Secondary | ICD-10-CM | POA: Diagnosis not present

## 2023-04-10 DIAGNOSIS — E1165 Type 2 diabetes mellitus with hyperglycemia: Secondary | ICD-10-CM | POA: Diagnosis not present

## 2023-04-10 DIAGNOSIS — J4520 Mild intermittent asthma, uncomplicated: Secondary | ICD-10-CM | POA: Diagnosis not present

## 2023-04-10 DIAGNOSIS — Z794 Long term (current) use of insulin: Secondary | ICD-10-CM | POA: Diagnosis not present

## 2023-04-10 DIAGNOSIS — K219 Gastro-esophageal reflux disease without esophagitis: Secondary | ICD-10-CM | POA: Diagnosis not present

## 2023-04-10 DIAGNOSIS — N1831 Chronic kidney disease, stage 3a: Secondary | ICD-10-CM | POA: Diagnosis not present

## 2023-04-10 DIAGNOSIS — D631 Anemia in chronic kidney disease: Secondary | ICD-10-CM | POA: Diagnosis not present

## 2023-04-10 DIAGNOSIS — I10 Essential (primary) hypertension: Secondary | ICD-10-CM | POA: Diagnosis not present

## 2023-04-13 DIAGNOSIS — N183 Chronic kidney disease, stage 3 unspecified: Secondary | ICD-10-CM | POA: Diagnosis not present

## 2023-04-13 DIAGNOSIS — N390 Urinary tract infection, site not specified: Secondary | ICD-10-CM | POA: Diagnosis not present

## 2023-04-13 DIAGNOSIS — E1122 Type 2 diabetes mellitus with diabetic chronic kidney disease: Secondary | ICD-10-CM | POA: Diagnosis not present

## 2023-04-13 DIAGNOSIS — N189 Chronic kidney disease, unspecified: Secondary | ICD-10-CM | POA: Diagnosis not present

## 2023-04-13 DIAGNOSIS — D631 Anemia in chronic kidney disease: Secondary | ICD-10-CM | POA: Diagnosis not present

## 2023-04-13 DIAGNOSIS — I129 Hypertensive chronic kidney disease with stage 1 through stage 4 chronic kidney disease, or unspecified chronic kidney disease: Secondary | ICD-10-CM | POA: Diagnosis not present

## 2023-04-13 DIAGNOSIS — E785 Hyperlipidemia, unspecified: Secondary | ICD-10-CM | POA: Diagnosis not present

## 2023-04-13 DIAGNOSIS — N2581 Secondary hyperparathyroidism of renal origin: Secondary | ICD-10-CM | POA: Diagnosis not present

## 2023-04-22 DIAGNOSIS — E1142 Type 2 diabetes mellitus with diabetic polyneuropathy: Secondary | ICD-10-CM | POA: Diagnosis not present

## 2023-05-05 DIAGNOSIS — I10 Essential (primary) hypertension: Secondary | ICD-10-CM | POA: Diagnosis not present

## 2023-05-05 DIAGNOSIS — E1142 Type 2 diabetes mellitus with diabetic polyneuropathy: Secondary | ICD-10-CM | POA: Diagnosis not present

## 2023-05-05 DIAGNOSIS — E782 Mixed hyperlipidemia: Secondary | ICD-10-CM | POA: Diagnosis not present

## 2023-05-05 DIAGNOSIS — Z7985 Long-term (current) use of injectable non-insulin antidiabetic drugs: Secondary | ICD-10-CM | POA: Diagnosis not present

## 2023-05-05 DIAGNOSIS — Z794 Long term (current) use of insulin: Secondary | ICD-10-CM | POA: Diagnosis not present

## 2023-05-05 DIAGNOSIS — Z7984 Long term (current) use of oral hypoglycemic drugs: Secondary | ICD-10-CM | POA: Diagnosis not present

## 2023-05-08 DIAGNOSIS — I10 Essential (primary) hypertension: Secondary | ICD-10-CM | POA: Diagnosis not present

## 2023-05-08 DIAGNOSIS — E782 Mixed hyperlipidemia: Secondary | ICD-10-CM | POA: Diagnosis not present

## 2023-05-08 DIAGNOSIS — E1142 Type 2 diabetes mellitus with diabetic polyneuropathy: Secondary | ICD-10-CM | POA: Diagnosis not present

## 2023-05-09 DIAGNOSIS — Z794 Long term (current) use of insulin: Secondary | ICD-10-CM | POA: Diagnosis not present

## 2023-05-09 DIAGNOSIS — K219 Gastro-esophageal reflux disease without esophagitis: Secondary | ICD-10-CM | POA: Diagnosis not present

## 2023-05-09 DIAGNOSIS — J4520 Mild intermittent asthma, uncomplicated: Secondary | ICD-10-CM | POA: Diagnosis not present

## 2023-05-09 DIAGNOSIS — E875 Hyperkalemia: Secondary | ICD-10-CM | POA: Diagnosis not present

## 2023-05-09 DIAGNOSIS — E1142 Type 2 diabetes mellitus with diabetic polyneuropathy: Secondary | ICD-10-CM | POA: Diagnosis not present

## 2023-05-09 DIAGNOSIS — I10 Essential (primary) hypertension: Secondary | ICD-10-CM | POA: Diagnosis not present

## 2023-05-09 DIAGNOSIS — N1831 Chronic kidney disease, stage 3a: Secondary | ICD-10-CM | POA: Diagnosis not present

## 2023-05-09 DIAGNOSIS — E1165 Type 2 diabetes mellitus with hyperglycemia: Secondary | ICD-10-CM | POA: Diagnosis not present

## 2023-05-09 DIAGNOSIS — E782 Mixed hyperlipidemia: Secondary | ICD-10-CM | POA: Diagnosis not present

## 2023-05-09 DIAGNOSIS — D631 Anemia in chronic kidney disease: Secondary | ICD-10-CM | POA: Diagnosis not present

## 2023-05-20 DIAGNOSIS — E1142 Type 2 diabetes mellitus with diabetic polyneuropathy: Secondary | ICD-10-CM | POA: Diagnosis not present

## 2023-06-20 DIAGNOSIS — E1142 Type 2 diabetes mellitus with diabetic polyneuropathy: Secondary | ICD-10-CM | POA: Diagnosis not present

## 2023-07-20 DIAGNOSIS — Z794 Long term (current) use of insulin: Secondary | ICD-10-CM | POA: Diagnosis not present

## 2023-07-20 DIAGNOSIS — N1831 Chronic kidney disease, stage 3a: Secondary | ICD-10-CM | POA: Diagnosis not present

## 2023-07-20 DIAGNOSIS — J4520 Mild intermittent asthma, uncomplicated: Secondary | ICD-10-CM | POA: Diagnosis not present

## 2023-07-20 DIAGNOSIS — I10 Essential (primary) hypertension: Secondary | ICD-10-CM | POA: Diagnosis not present

## 2023-07-20 DIAGNOSIS — E782 Mixed hyperlipidemia: Secondary | ICD-10-CM | POA: Diagnosis not present

## 2023-07-20 DIAGNOSIS — E1165 Type 2 diabetes mellitus with hyperglycemia: Secondary | ICD-10-CM | POA: Diagnosis not present

## 2023-07-20 DIAGNOSIS — E1142 Type 2 diabetes mellitus with diabetic polyneuropathy: Secondary | ICD-10-CM | POA: Diagnosis not present

## 2023-08-17 ENCOUNTER — Other Ambulatory Visit: Payer: Self-pay | Admitting: Physician Assistant

## 2023-08-17 DIAGNOSIS — Z1231 Encounter for screening mammogram for malignant neoplasm of breast: Secondary | ICD-10-CM

## 2023-08-20 DIAGNOSIS — E1142 Type 2 diabetes mellitus with diabetic polyneuropathy: Secondary | ICD-10-CM | POA: Diagnosis not present

## 2023-09-19 DIAGNOSIS — E1142 Type 2 diabetes mellitus with diabetic polyneuropathy: Secondary | ICD-10-CM | POA: Diagnosis not present

## 2023-10-20 DIAGNOSIS — E1142 Type 2 diabetes mellitus with diabetic polyneuropathy: Secondary | ICD-10-CM | POA: Diagnosis not present

## 2023-10-26 DIAGNOSIS — E1142 Type 2 diabetes mellitus with diabetic polyneuropathy: Secondary | ICD-10-CM | POA: Diagnosis not present

## 2023-10-26 DIAGNOSIS — I10 Essential (primary) hypertension: Secondary | ICD-10-CM | POA: Diagnosis not present

## 2023-10-26 DIAGNOSIS — E782 Mixed hyperlipidemia: Secondary | ICD-10-CM | POA: Diagnosis not present

## 2023-11-03 NOTE — Telephone Encounter (Signed)
 10/26/23 OV Note and next scheduled visit date faxed to [facility] @ [phone]

## 2023-11-20 DIAGNOSIS — E1142 Type 2 diabetes mellitus with diabetic polyneuropathy: Secondary | ICD-10-CM | POA: Diagnosis not present

## 2023-11-24 DIAGNOSIS — E1142 Type 2 diabetes mellitus with diabetic polyneuropathy: Secondary | ICD-10-CM | POA: Diagnosis not present

## 2023-11-24 DIAGNOSIS — Z0001 Encounter for general adult medical examination with abnormal findings: Secondary | ICD-10-CM | POA: Diagnosis not present

## 2023-11-24 DIAGNOSIS — E1165 Type 2 diabetes mellitus with hyperglycemia: Secondary | ICD-10-CM | POA: Diagnosis not present

## 2023-11-24 DIAGNOSIS — E782 Mixed hyperlipidemia: Secondary | ICD-10-CM | POA: Diagnosis not present

## 2023-11-24 DIAGNOSIS — Z794 Long term (current) use of insulin: Secondary | ICD-10-CM | POA: Diagnosis not present

## 2023-11-24 DIAGNOSIS — D631 Anemia in chronic kidney disease: Secondary | ICD-10-CM | POA: Diagnosis not present

## 2023-11-24 DIAGNOSIS — N1831 Chronic kidney disease, stage 3a: Secondary | ICD-10-CM | POA: Diagnosis not present

## 2023-11-24 DIAGNOSIS — I10 Essential (primary) hypertension: Secondary | ICD-10-CM | POA: Diagnosis not present

## 2023-11-24 DIAGNOSIS — K219 Gastro-esophageal reflux disease without esophagitis: Secondary | ICD-10-CM | POA: Diagnosis not present

## 2023-11-24 DIAGNOSIS — J4520 Mild intermittent asthma, uncomplicated: Secondary | ICD-10-CM | POA: Diagnosis not present

## 2023-12-18 DIAGNOSIS — N183 Chronic kidney disease, stage 3 unspecified: Secondary | ICD-10-CM | POA: Diagnosis not present

## 2023-12-20 DIAGNOSIS — E1142 Type 2 diabetes mellitus with diabetic polyneuropathy: Secondary | ICD-10-CM | POA: Diagnosis not present

## 2023-12-28 DIAGNOSIS — N390 Urinary tract infection, site not specified: Secondary | ICD-10-CM | POA: Diagnosis not present

## 2023-12-28 DIAGNOSIS — D631 Anemia in chronic kidney disease: Secondary | ICD-10-CM | POA: Diagnosis not present

## 2023-12-28 DIAGNOSIS — E785 Hyperlipidemia, unspecified: Secondary | ICD-10-CM | POA: Diagnosis not present

## 2023-12-28 DIAGNOSIS — I129 Hypertensive chronic kidney disease with stage 1 through stage 4 chronic kidney disease, or unspecified chronic kidney disease: Secondary | ICD-10-CM | POA: Diagnosis not present

## 2023-12-28 DIAGNOSIS — N2581 Secondary hyperparathyroidism of renal origin: Secondary | ICD-10-CM | POA: Diagnosis not present

## 2023-12-28 DIAGNOSIS — N183 Chronic kidney disease, stage 3 unspecified: Secondary | ICD-10-CM | POA: Diagnosis not present

## 2023-12-28 DIAGNOSIS — E1122 Type 2 diabetes mellitus with diabetic chronic kidney disease: Secondary | ICD-10-CM | POA: Diagnosis not present
